# Patient Record
Sex: Male | Born: 1965 | Race: Black or African American | Hispanic: No | Marital: Married
Health system: Southern US, Community
[De-identification: ages and names within clinical notes are randomized; demographics above are authoritative.]

## PROBLEM LIST (undated history)

## (undated) DIAGNOSIS — I10 Essential (primary) hypertension: Secondary | ICD-10-CM

## (undated) DIAGNOSIS — E119 Type 2 diabetes mellitus without complications: Secondary | ICD-10-CM

---

## 2004-05-06 ENCOUNTER — Other Ambulatory Visit: Payer: Self-pay

## 2005-01-14 ENCOUNTER — Emergency Department: Payer: Self-pay | Admitting: Emergency Medicine

## 2005-04-20 ENCOUNTER — Ambulatory Visit: Payer: Self-pay | Admitting: Family Medicine

## 2006-10-04 ENCOUNTER — Other Ambulatory Visit: Payer: Self-pay

## 2006-10-04 ENCOUNTER — Emergency Department: Payer: Self-pay | Admitting: Emergency Medicine

## 2007-08-25 ENCOUNTER — Emergency Department: Payer: Self-pay | Admitting: Emergency Medicine

## 2007-10-20 ENCOUNTER — Emergency Department: Payer: Self-pay | Admitting: Emergency Medicine

## 2007-10-20 ENCOUNTER — Other Ambulatory Visit: Payer: Self-pay

## 2007-12-16 IMAGING — CR DG CHEST 2V
1 series · 2 of 2 positions shown · non-contrast
Comparison: none

REASON FOR EXAM: Angina, diaphoresis
COMMENTS:

PROCEDURE:     DXR - DXR CHEST PA (OR AP) AND LATERAL  - October 04, 2006  [DATE]
RESULT:     The current exam is compared to a prior exam of 01/14/2005.
The lung fields are clear. The heart, mediastinal and osseous structures are
normal in appearance.

[Series 1: view not recorded · 0.17mm/px · 2 of 2 slices shown]
[im 1/2]
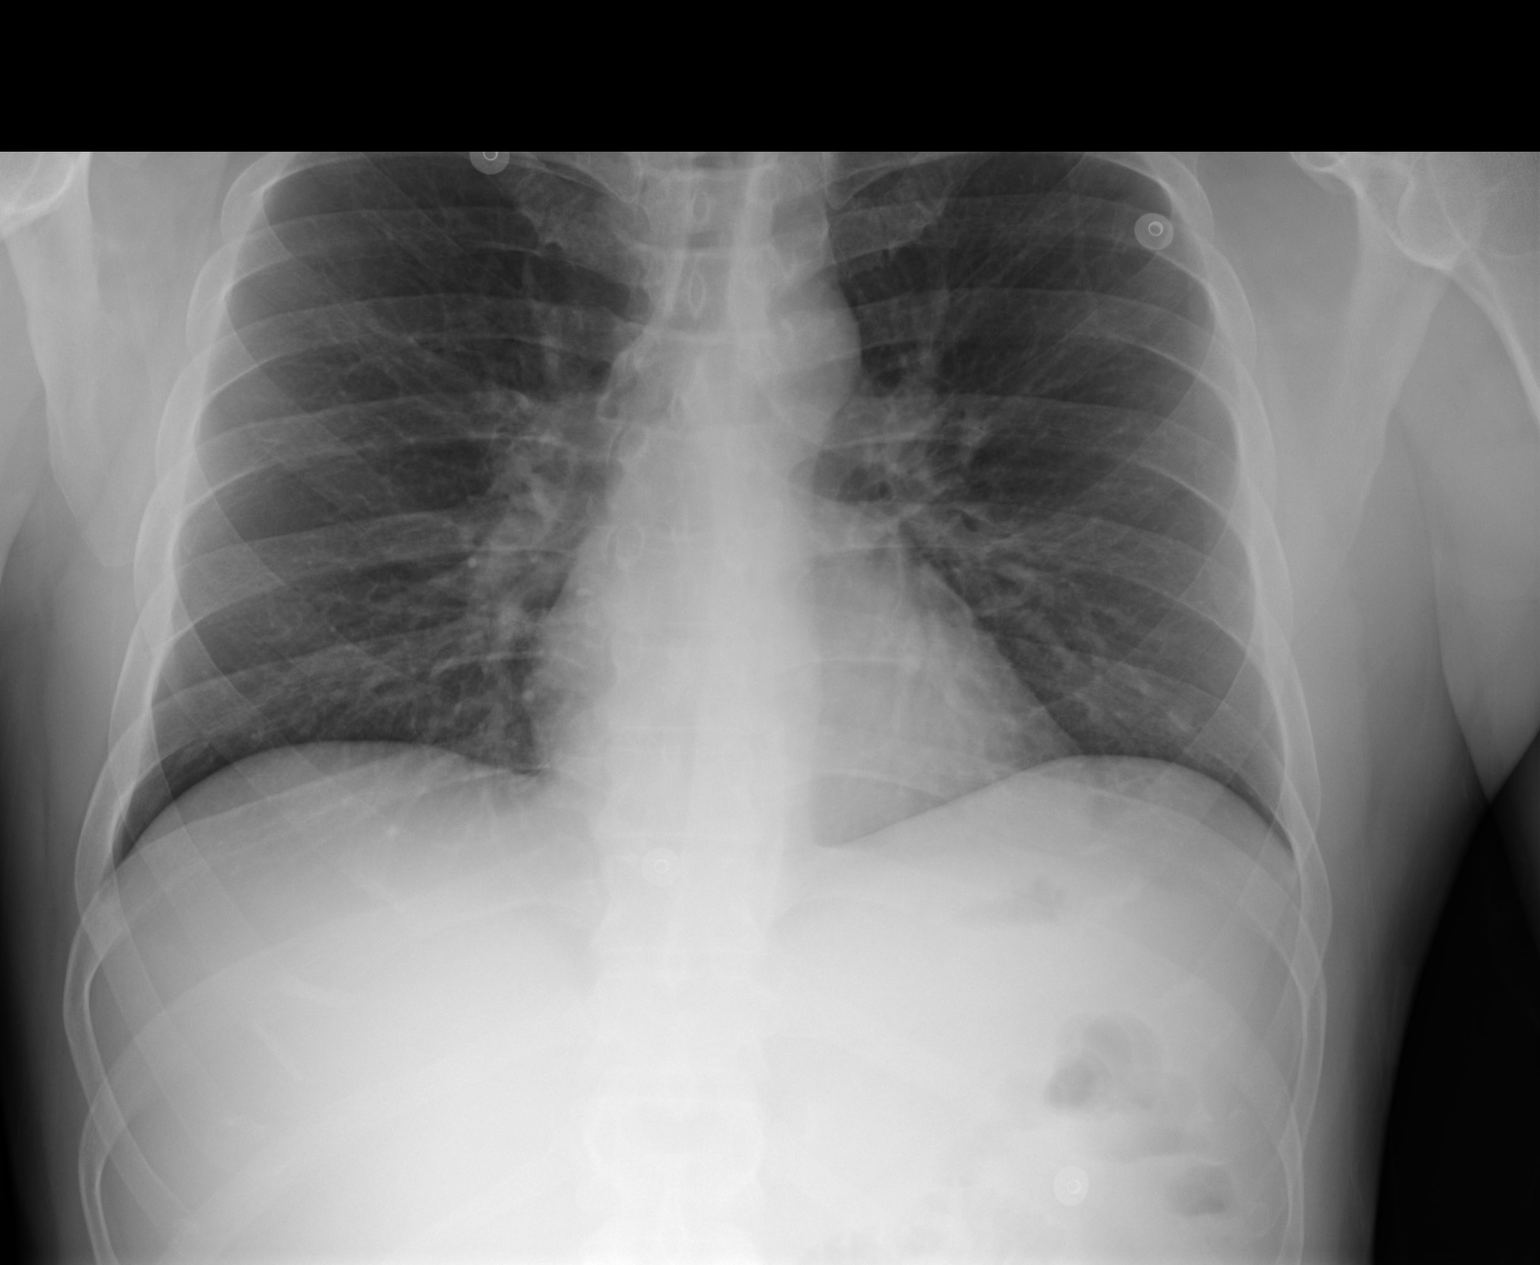
[im 2/2]
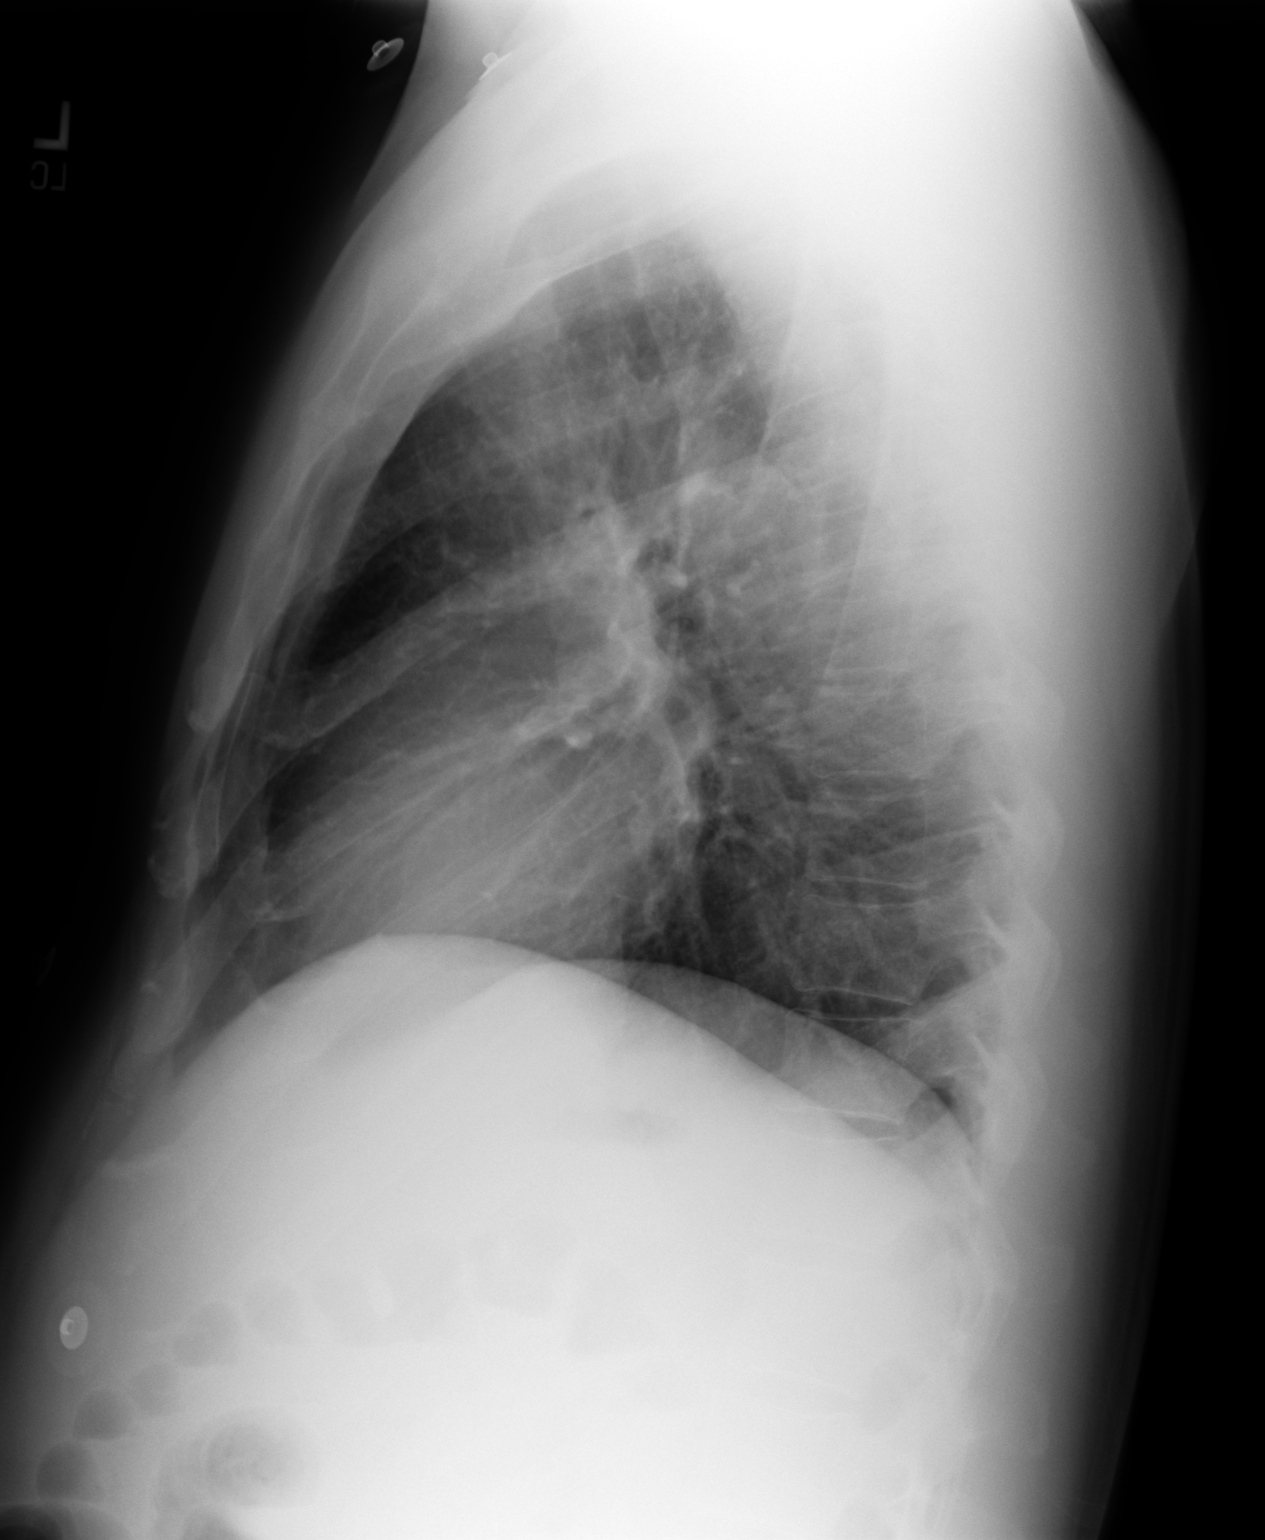

[2 of 2 positions shown; findings below may reference images not displayed]

IMPRESSION: No significant abnormalities are noted.

## 2008-06-05 ENCOUNTER — Emergency Department: Payer: Self-pay
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
## 2008-12-31 IMAGING — CR DG CHEST 2V
1 series · 2 of 2 positions shown · non-contrast
Comparison: none

REASON FOR EXAM: SOB
COMMENTS:

PROCEDURE:     DXR - DXR CHEST PA (OR AP) AND LATERAL  - October 20, 2007  [DATE]
RESULT:     Comparison is made to the prior exam of 10/04/06. The lung fields
are clear. The heart, mediastinal and osseous structures show no significant
abnormalities.

[Series 1: view not recorded · 0.17mm/px · 2 of 2 slices shown]
[im 1/2]
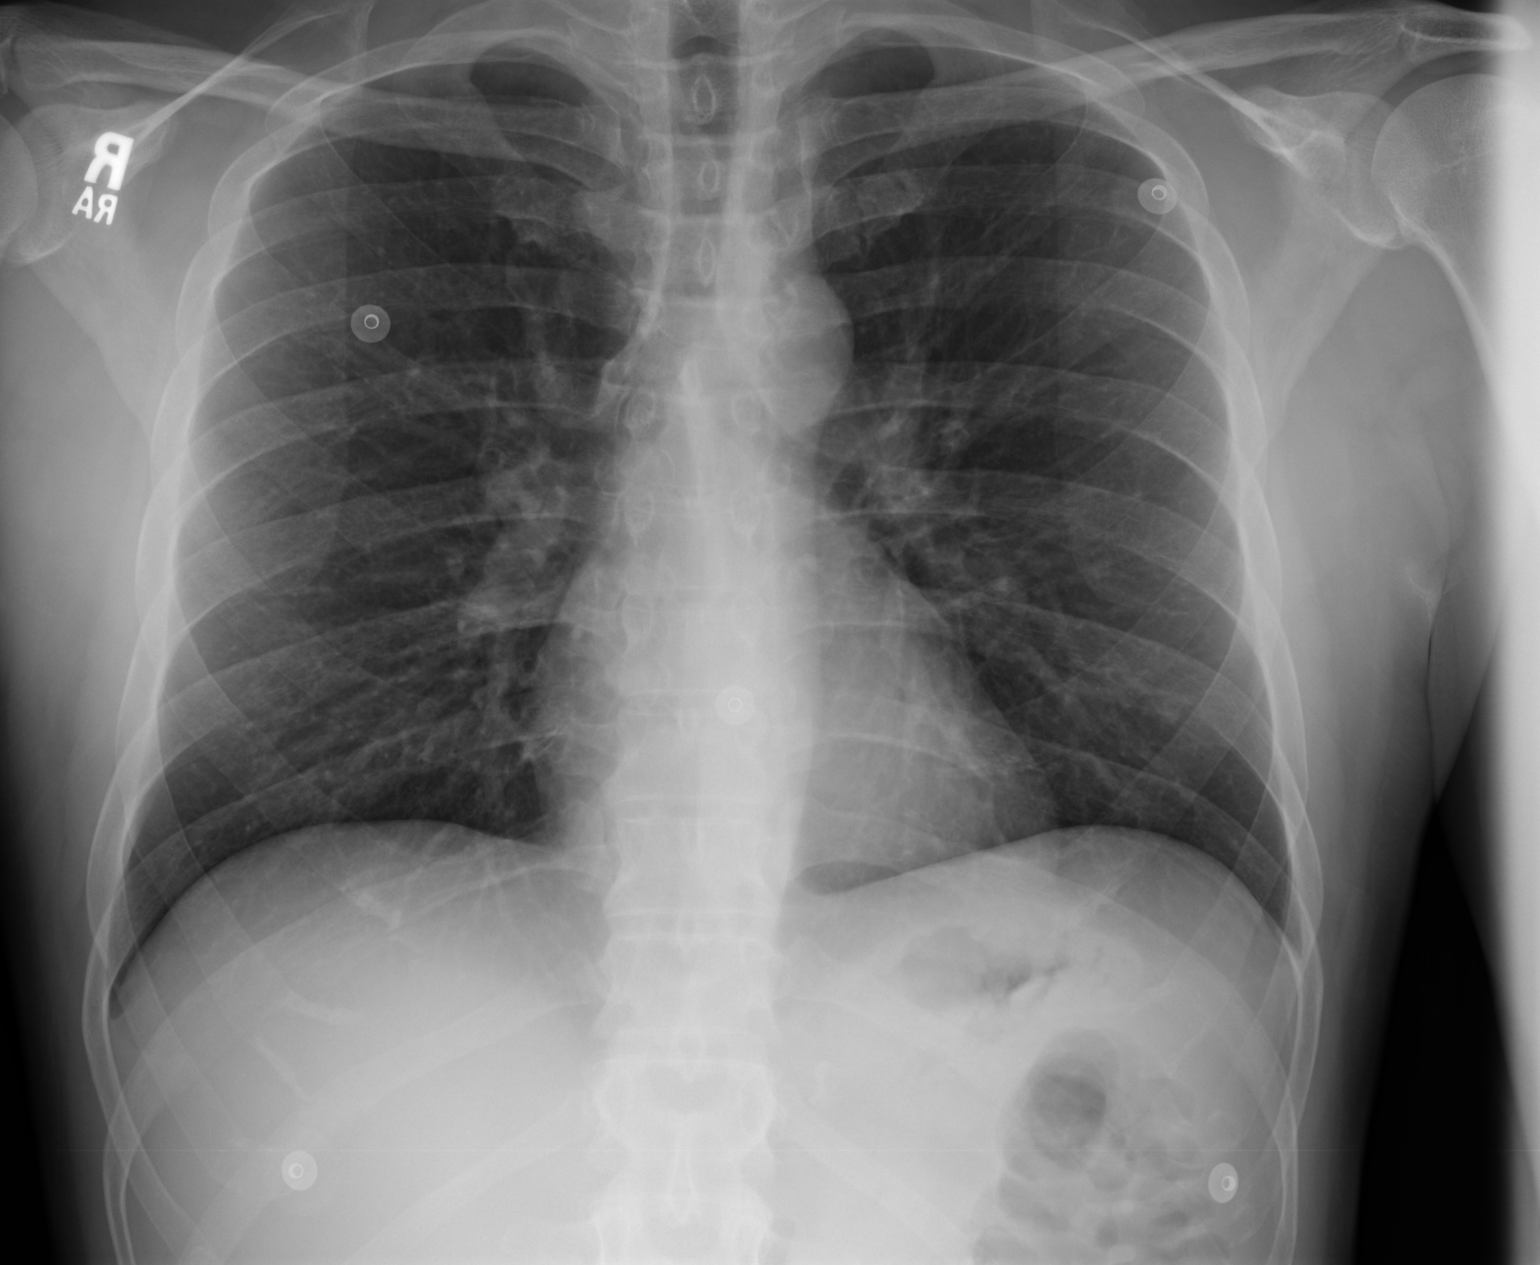
[im 2/2]
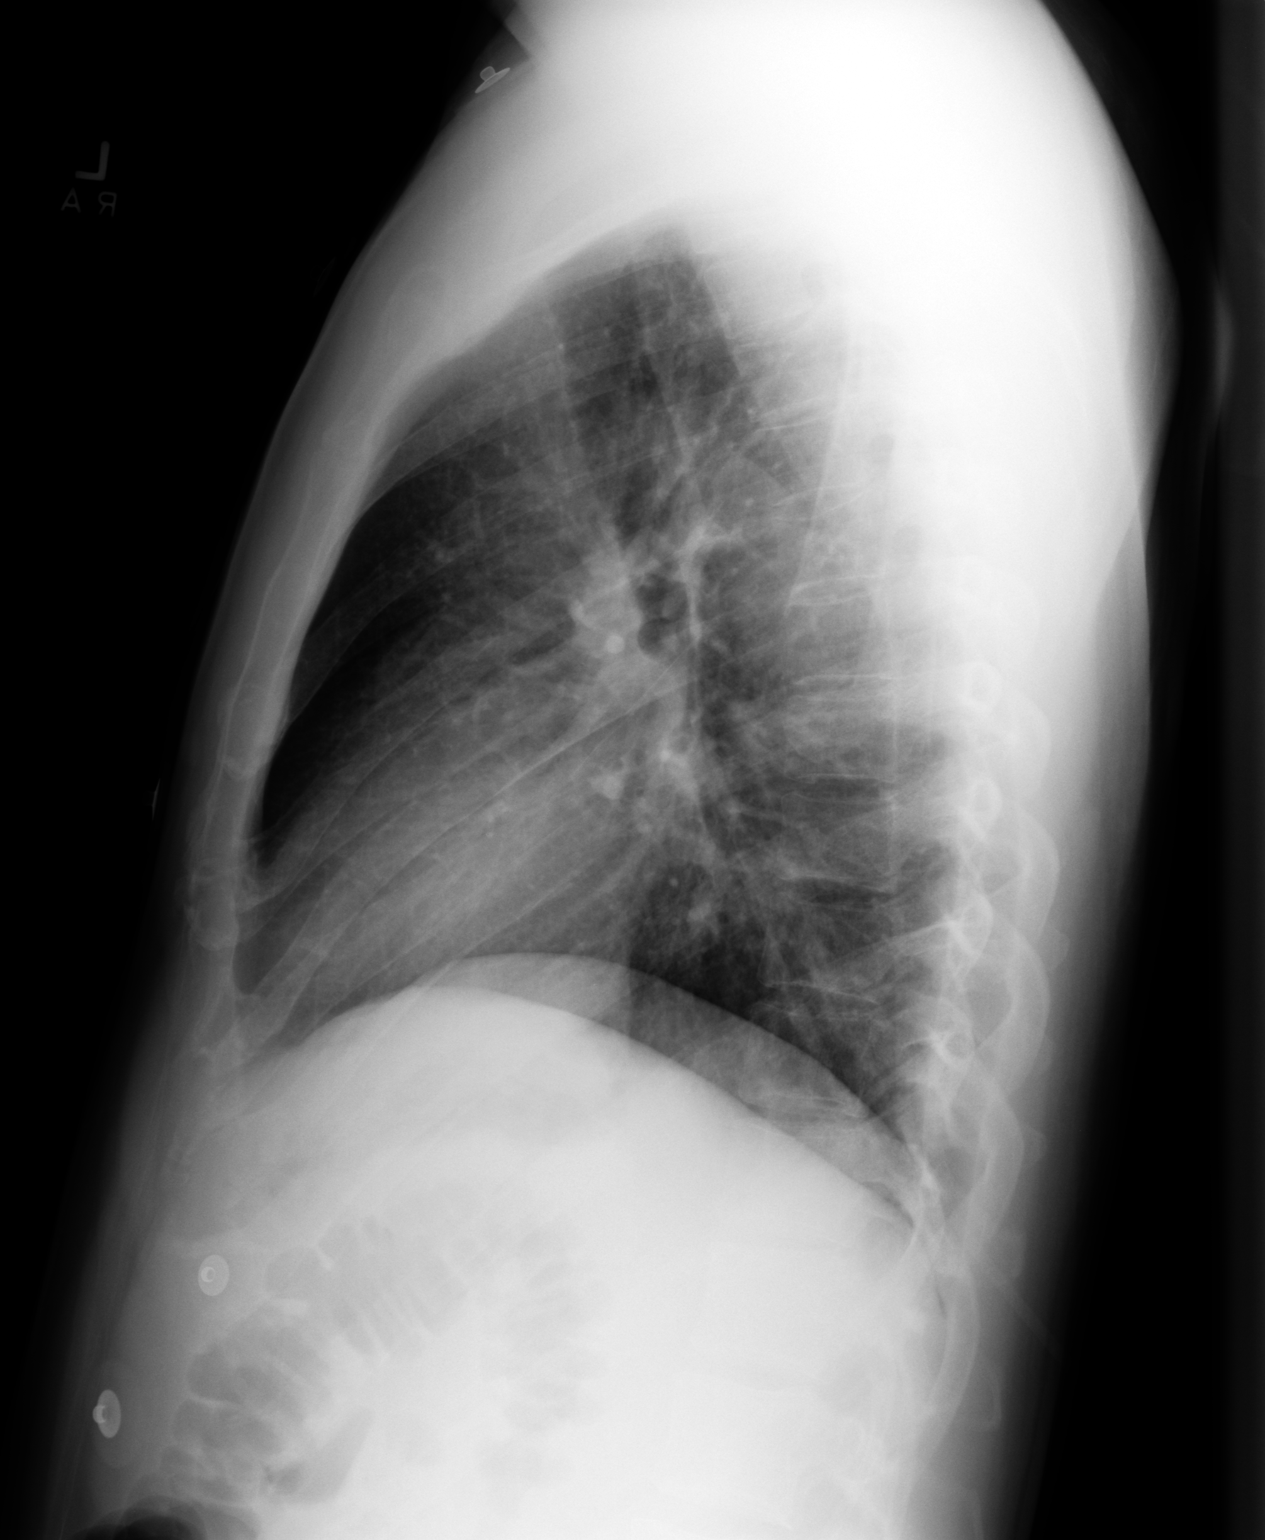

[2 of 2 positions shown; findings below may reference images not displayed]

IMPRESSION: No acute changes are identified.

## 2014-01-30 ENCOUNTER — Emergency Department: Payer: Self-pay | Admitting: Emergency Medicine

## 2018-10-11 ENCOUNTER — Emergency Department
Admission: EM | Admit: 2018-10-11 | Discharge: 2018-10-11 | Disposition: A | Discharge status: Home / Self Care | Payer: Self-pay | Attending: Emergency Medicine | Admitting: Emergency Medicine

## 2018-10-11 ENCOUNTER — Emergency Department: Payer: Self-pay

## 2018-10-11 ENCOUNTER — Encounter: Payer: Self-pay | Admitting: Medical Oncology

## 2018-10-11 DIAGNOSIS — R531 Weakness: Secondary | ICD-10-CM | POA: Insufficient documentation

## 2018-10-11 DIAGNOSIS — R110 Nausea: Secondary | ICD-10-CM | POA: Insufficient documentation

## 2018-10-11 DIAGNOSIS — J111 Influenza due to unidentified influenza virus with other respiratory manifestations: Secondary | ICD-10-CM | POA: Insufficient documentation

## 2018-10-11 DIAGNOSIS — R6889 Other general symptoms and signs: Secondary | ICD-10-CM

## 2018-10-11 DIAGNOSIS — R05 Cough: Secondary | ICD-10-CM | POA: Insufficient documentation

## 2018-10-11 DIAGNOSIS — E119 Type 2 diabetes mellitus without complications: Secondary | ICD-10-CM | POA: Insufficient documentation

## 2018-10-11 DIAGNOSIS — M7918 Myalgia, other site: Secondary | ICD-10-CM | POA: Insufficient documentation

## 2018-10-11 DIAGNOSIS — I10 Essential (primary) hypertension: Secondary | ICD-10-CM | POA: Insufficient documentation

## 2018-10-11 HISTORY — DX: Type 2 diabetes mellitus without complications: E11.9

## 2018-10-11 HISTORY — DX: Essential (primary) hypertension: I10

## 2018-10-11 LAB — COMPREHENSIVE METABOLIC PANEL
ALT: 33 U/L (ref 0–44)
AST: 42 U/L — ABNORMAL HIGH (ref 15–41)
Albumin: 4.2 g/dL (ref 3.5–5.0)
Alkaline Phosphatase: 55 U/L (ref 38–126)
Anion gap: 12 (ref 5–15)
BUN: 14 mg/dL (ref 6–20)
CHLORIDE: 101 mmol/L (ref 98–111)
CO2: 25 mmol/L (ref 22–32)
Calcium: 9.9 mg/dL (ref 8.9–10.3)
Creatinine, Ser: 1.5 mg/dL — ABNORMAL HIGH (ref 0.61–1.24)
GFR calc Af Amer: >60 mL/min (ref >60)
GFR calc non Af Amer: 52 mL/min — ABNORMAL LOW (ref >60)
Glucose, Bld: 272 mg/dL — ABNORMAL HIGH (ref 70–99)
Potassium: 3.7 mmol/L (ref 3.5–5.1)
Sodium: 138 mmol/L (ref 135–145)
Total Bilirubin: 0.5 mg/dL (ref 0.3–1.2)
Total Protein: 7.7 g/dL (ref 6.5–8.1)

## 2018-10-11 LAB — CBC
HEMATOCRIT: 44.5 % (ref 39.0–52.0)
Hemoglobin: 14.5 g/dL (ref 13.0–17.0)
MCH: 29.1 pg (ref 26.0–34.0)
MCHC: 32.6 g/dL (ref 30.0–36.0)
MCV: 89.2 fL (ref 80.0–100.0)
NRBC: 0 % (ref 0.0–0.2)
Platelets: 185 10*3/uL (ref 150–400)
RBC: 4.99 MIL/uL (ref 4.22–5.81)
RDW: 12.7 % (ref 11.5–15.5)
WBC: 7.3 10*3/uL (ref 4.0–10.5)

## 2018-10-11 LAB — INFLUENZA PANEL BY PCR (TYPE A & B)
Influenza A By PCR: NEGATIVE
Influenza B By PCR: NEGATIVE

## 2018-10-11 LAB — LIPASE, BLOOD: LIPASE: 51 U/L (ref 11–51)

## 2018-10-11 MED ORDER — LORAZEPAM 2 MG/ML IJ SOLN
0.5000 mg | Freq: Once | INTRAMUSCULAR | Status: AC
  Administered 2018-10-11: 0.5 mg via INTRAVENOUS
  Filled 2018-10-11: qty 1

## 2018-10-11 MED ORDER — SODIUM CHLORIDE 0.9 % IV SOLN
1000.0000 mL | Freq: Once | INTRAVENOUS | Status: AC
  Administered 2018-10-11: 1000 mL via INTRAVENOUS

## 2018-10-11 NOTE — ED Triage Notes (Signed)
Pt from home via ems with reports of "just feeling sick". Pt describes gen body aches, nausea, vomiting and diarrhea for about 1 week. Pt reports that he just does not feel well.

## 2018-10-11 NOTE — ED Notes (Signed)
EMS pt to lobby , complaining of darker than normal color urine , not feeling well x1 week with fatigue/ flu like symptoms . CBG 290, mask on in the lobby

## 2018-10-11 NOTE — ED Provider Notes (Signed)
[facility] Emergency Department Provider Note   ____________________________________________    I have reviewed the triage vital signs and the nursing notes.   HISTORY  Chief Complaint Generalized Body Aches; Nausea; and Emesis     HPI Antonio Burton is a 53 y.o. male who presents with complaints of fatigue, weakness, mild nausea, body aches over approximately 1 week.  Denies fevers or chills.  Has had a cough, nonproductive.  No chest pain or shortness of breath.  Does admit to drinking alcohol daily, this is a longstanding problem.  Denies drug use.  No recent travel.  No diarrhea.  No abdominal pain   Past Medical History:  Diagnosis Date  . Diabetes mellitus without complication (HCC)   . Hypertension     There are no active problems to display for this patient.     Prior to Admission medications   Not on File     Allergies Patient has no known allergies.  No family history on file.  Social History Daily alcohol use, denies drug use Review of Systems  Constitutional: Fatigue as above Eyes: No visual changes.  ENT: No sore throat. Cardiovascular: Denies chest pain. Respiratory: Denies shortness of breath. Gastrointestinal: No abdominal pain.   Genitourinary: Negative for dysuria. Musculoskeletal: Negative for back pain.  Myalgias  skin: Negative for rash. Neurological: Negative for headaches    ____________________________________________   PHYSICAL EXAM:  VITAL SIGNS: ED Triage Vitals  Enc Vitals Group     BP 10/11/18 1711 (!) 149/90     Pulse Rate 10/11/18 1711 (!) 122     Resp 10/11/18 1711 20     Temp 10/11/18 1711 98.5 F (36.9 C)     Temp Source 10/11/18 1711 Oral     SpO2 10/11/18 1711 99 %     Weight 10/11/18 1711 86.2 kg (190 lb)     Height 10/11/18 1711 1.854 m (6\' 1" )     Head Circumference --      Peak Flow --      Pain Score 10/11/18 1724 10     Pain Loc --      Pain Edu? --      Excl. in GC?  --     Constitutional: Alert and oriented. No acute distress.   Nose: No congestion/rhinnorhea. Mouth/Throat: Mucous membranes are moist.   Neck:  Painless ROM Cardiovascular: Tachycardia, regular rhythm. Grossly normal heart sounds.  Good peripheral circulation. Respiratory: Normal respiratory effort.  No retractions. Lungs CTAB. Gastrointestinal: Soft and nontender. No distention.  No CVA tenderness.  Musculoskeletal: No lower extremity tenderness nor edema.  Warm and well perfused Neurologic:  Normal speech and language. No gross focal neurologic deficits are appreciated.  Skin:  Skin is warm, dry and intact. No rash noted. Psychiatric: Mood and affect are normal. Speech and behavior are normal.  ____________________________________________   LABS (all labs ordered are listed, but only abnormal results are displayed)  Labs Reviewed  COMPREHENSIVE METABOLIC PANEL - Abnormal; Notable for the following components:      Result Value   Glucose, Bld 272 (*)    Creatinine, Ser 1.50 (*)    AST 42 (*)    GFR calc non Af Amer 52 (*)    All other components within normal limits  LIPASE, BLOOD  CBC  INFLUENZA PANEL BY PCR (TYPE A & B)  URINALYSIS, COMPLETE (UACMP) WITH MICROSCOPIC  URINE DRUG SCREEN, QUALITATIVE ([facility] ONLY)   ____________________________________________  EKG  ED ECG REPORT  I, Jene Everyobert Ares Cardozo, the attending physician, personally viewed and interpreted this ECG.  Date: 10/11/2018  Rhythm: Sinus tachycardia QRS Axis: normal Intervals: normal ST/T Wave abnormalities: normal Narrative Interpretation: no evidence of acute ischemia  ____________________________________________  RADIOLOGY  Chest x-ray negative for pneumonia ____________________________________________   PROCEDURES  Procedure(s) performed: No  Procedures   Critical Care performed: No ____________________________________________   INITIAL IMPRESSION / ASSESSMENT AND PLAN / ED  COURSE  Pertinent labs & imaging results that were available during my care of the patient were reviewed by me and considered in my medical decision making (see chart for details).  Patient overall well-appearing and in no acute distress, vital signs are significant for an elevated heart rate initially.  Patient given IV fluids small dose of IV Ativan given history of alcoholism with significant improvement in heart rate.  Lab work is overall unremarkable, influenza negative, chest x-ray negative for pneumonia the patient is feeling significantly better after treatment.  Discussed with patient brother and daughter and they agree with discharge with outpatient follow-up    ____________________________________________   FINAL CLINICAL IMPRESSION(S) / ED DIAGNOSES  Final diagnoses:  Generalized weakness  Flu-like symptoms        Note:  This document was prepared using Dragon voice recognition software and may include unintentional dictation errors.   Jene EveryKinner, Tammee Thielke, MD 10/11/18 1944

## 2018-10-11 NOTE — ED Notes (Signed)
Pt attempted to void to provide a urine sample but was not able to provide one.

## 2019-12-23 IMAGING — CR DG CHEST 2V
1 series · 2 of 2 positions shown · non-contrast
Comparison: PA and lateral chest 10/20/2007.

CLINICAL DATA: Fatigue and flu like symptoms for 1 week.

EXAM:
CHEST - 2 VIEW

[Series 1: w chest pa · 0.14mm/px · 2 of 2 slices shown]
[im 1/2]
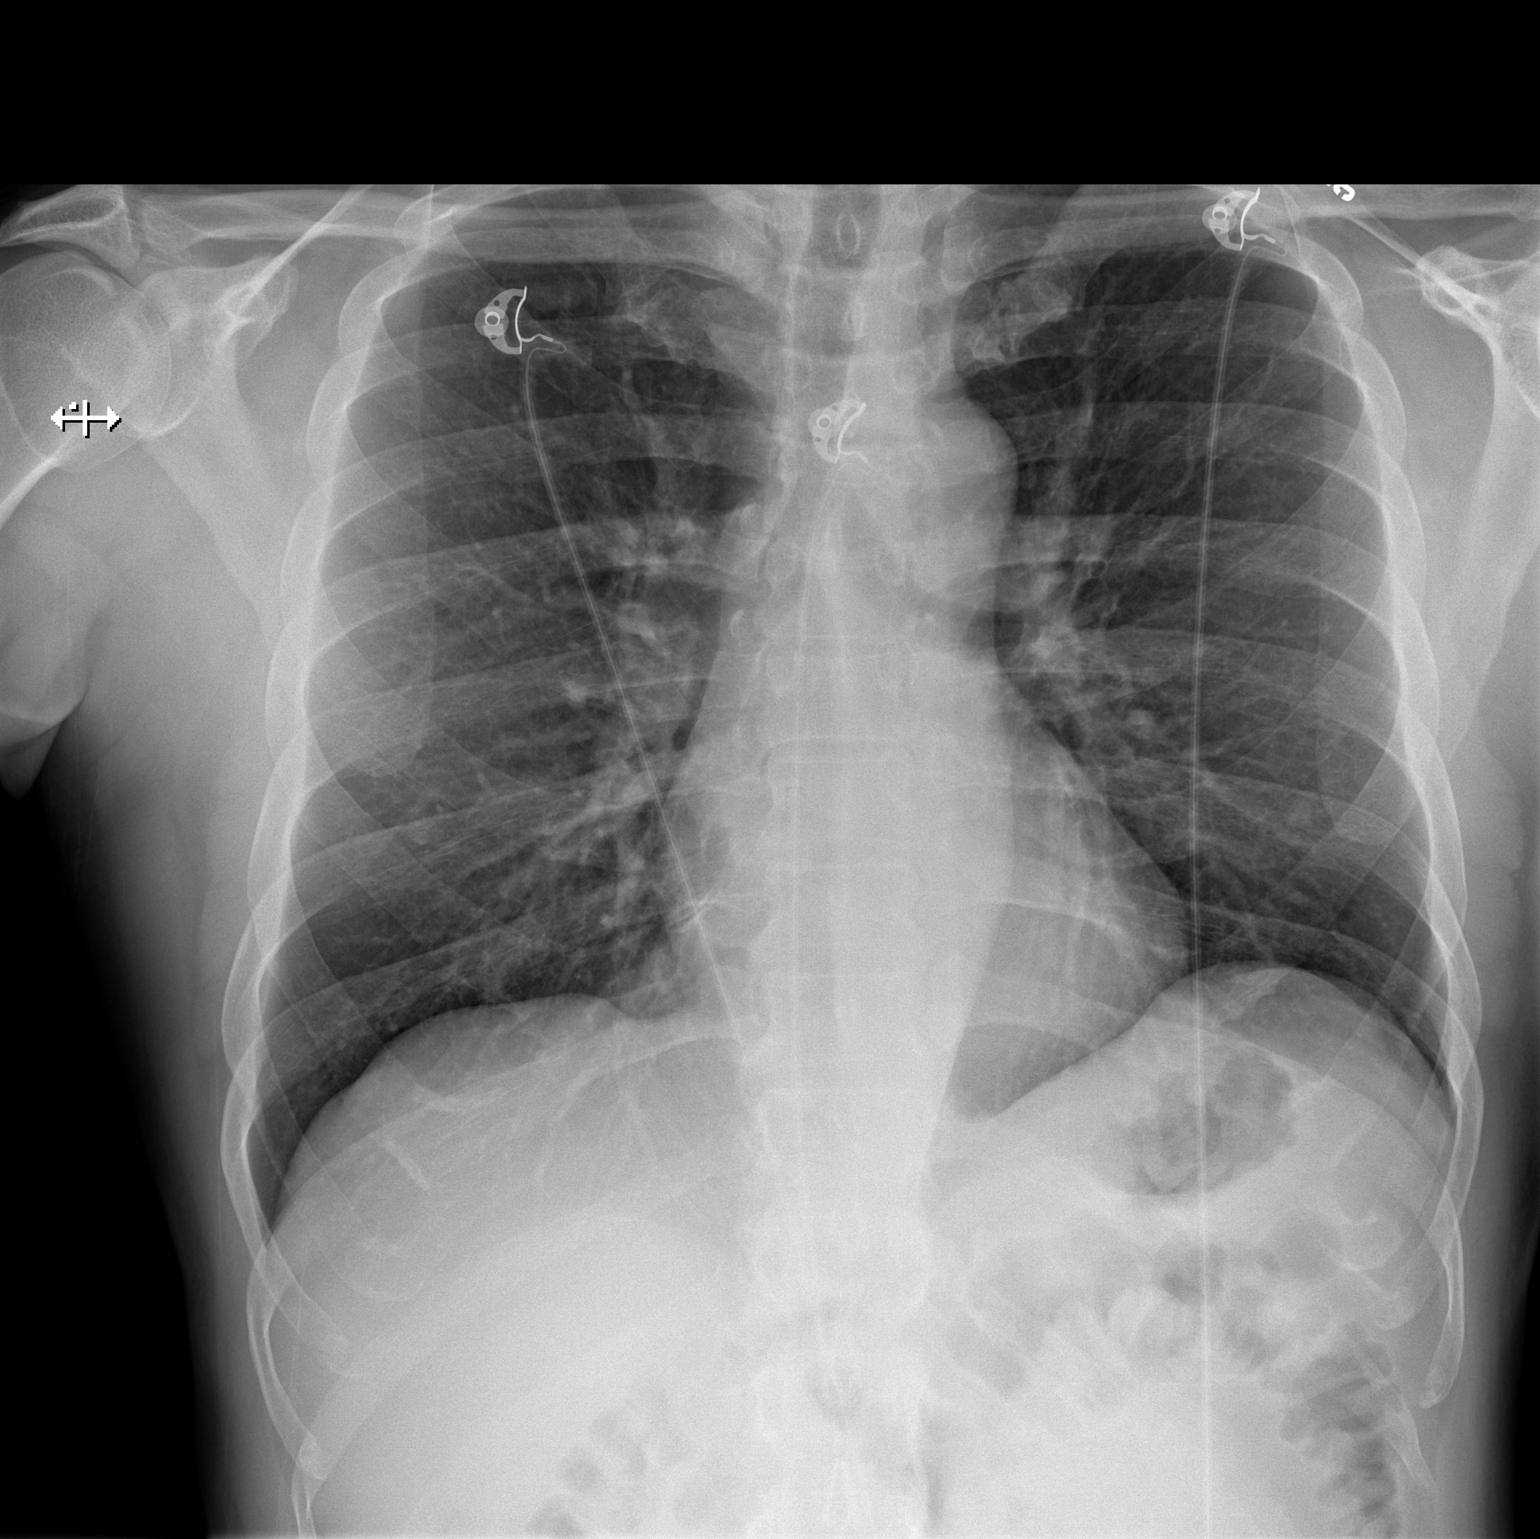
[im 2/2]
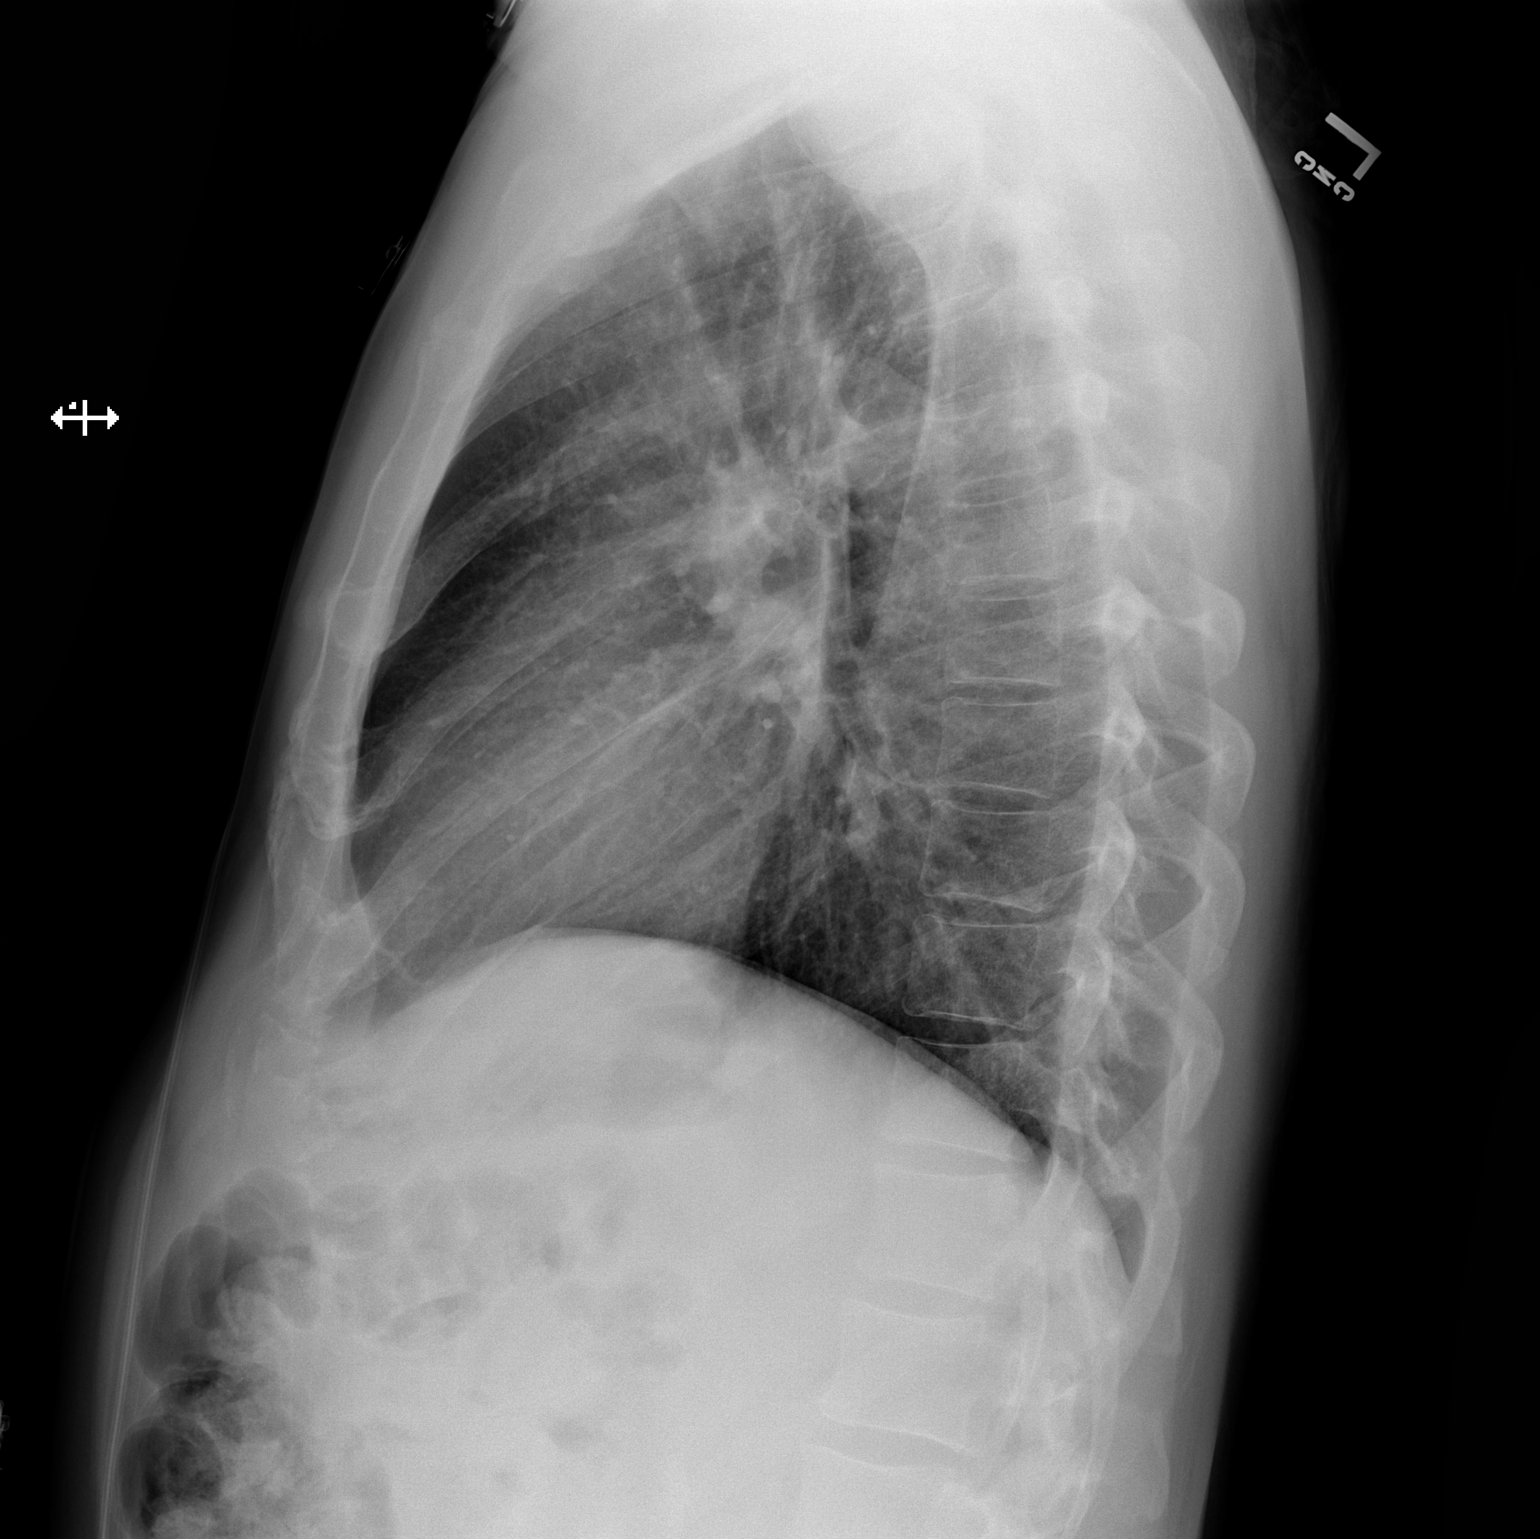

[2 of 2 positions shown; findings below may reference images not displayed]

FINDINGS: Lungs clear. Heart size normal. No pneumothorax or pleural fluid. No
acute or focal bony abnormality.
IMPRESSION: Negative chest.

## 2023-04-23 DIAGNOSIS — M5441 Lumbago with sciatica, right side: Secondary | ICD-10-CM | POA: Diagnosis not present

## 2023-04-23 DIAGNOSIS — D696 Thrombocytopenia, unspecified: Secondary | ICD-10-CM | POA: Diagnosis not present

## 2023-04-23 DIAGNOSIS — E119 Type 2 diabetes mellitus without complications: Secondary | ICD-10-CM | POA: Diagnosis not present

## 2023-04-23 DIAGNOSIS — I10 Essential (primary) hypertension: Secondary | ICD-10-CM | POA: Diagnosis not present

## 2023-04-23 DIAGNOSIS — F17210 Nicotine dependence, cigarettes, uncomplicated: Secondary | ICD-10-CM | POA: Diagnosis not present

## 2023-04-23 DIAGNOSIS — Z794 Long term (current) use of insulin: Secondary | ICD-10-CM | POA: Diagnosis not present

## 2023-11-17 DIAGNOSIS — Z20822 Contact with and (suspected) exposure to covid-19: Secondary | ICD-10-CM | POA: Diagnosis not present

## 2023-11-17 DIAGNOSIS — F17210 Nicotine dependence, cigarettes, uncomplicated: Secondary | ICD-10-CM | POA: Diagnosis not present

## 2023-11-17 DIAGNOSIS — Z7984 Long term (current) use of oral hypoglycemic drugs: Secondary | ICD-10-CM | POA: Diagnosis not present

## 2023-11-17 DIAGNOSIS — Z794 Long term (current) use of insulin: Secondary | ICD-10-CM | POA: Diagnosis not present

## 2023-11-17 DIAGNOSIS — F32A Depression, unspecified: Secondary | ICD-10-CM | POA: Diagnosis not present

## 2023-11-17 DIAGNOSIS — E119 Type 2 diabetes mellitus without complications: Secondary | ICD-10-CM | POA: Diagnosis not present

## 2023-11-17 DIAGNOSIS — Z79899 Other long term (current) drug therapy: Secondary | ICD-10-CM | POA: Diagnosis not present

## 2023-11-17 DIAGNOSIS — R059 Cough, unspecified: Secondary | ICD-10-CM | POA: Diagnosis not present

## 2023-11-17 DIAGNOSIS — R058 Other specified cough: Secondary | ICD-10-CM | POA: Diagnosis not present

## 2023-11-17 DIAGNOSIS — I10 Essential (primary) hypertension: Secondary | ICD-10-CM | POA: Diagnosis not present

## 2024-03-05 DIAGNOSIS — R197 Diarrhea, unspecified: Secondary | ICD-10-CM | POA: Diagnosis not present

## 2024-03-05 DIAGNOSIS — E119 Type 2 diabetes mellitus without complications: Secondary | ICD-10-CM | POA: Diagnosis not present

## 2024-03-05 DIAGNOSIS — R1011 Right upper quadrant pain: Secondary | ICD-10-CM | POA: Diagnosis not present

## 2024-03-05 DIAGNOSIS — K760 Fatty (change of) liver, not elsewhere classified: Secondary | ICD-10-CM | POA: Diagnosis not present

## 2024-03-05 DIAGNOSIS — I10 Essential (primary) hypertension: Secondary | ICD-10-CM | POA: Diagnosis not present

## 2024-03-05 DIAGNOSIS — Z794 Long term (current) use of insulin: Secondary | ICD-10-CM | POA: Diagnosis not present

## 2024-03-05 DIAGNOSIS — R000 Tachycardia, unspecified: Secondary | ICD-10-CM | POA: Diagnosis not present

## 2024-03-05 DIAGNOSIS — R112 Nausea with vomiting, unspecified: Secondary | ICD-10-CM | POA: Diagnosis not present

## 2024-03-19 DIAGNOSIS — R1084 Generalized abdominal pain: Secondary | ICD-10-CM | POA: Diagnosis not present

## 2024-03-19 DIAGNOSIS — R634 Abnormal weight loss: Secondary | ICD-10-CM | POA: Diagnosis not present

## 2024-03-19 DIAGNOSIS — R197 Diarrhea, unspecified: Secondary | ICD-10-CM | POA: Diagnosis not present

## 2024-03-19 DIAGNOSIS — R112 Nausea with vomiting, unspecified: Secondary | ICD-10-CM | POA: Diagnosis not present

## 2024-03-19 DIAGNOSIS — K760 Fatty (change of) liver, not elsewhere classified: Secondary | ICD-10-CM | POA: Diagnosis not present

## 2024-03-19 DIAGNOSIS — R0789 Other chest pain: Secondary | ICD-10-CM | POA: Diagnosis not present

## 2024-03-19 DIAGNOSIS — F1090 Alcohol use, unspecified, uncomplicated: Secondary | ICD-10-CM | POA: Diagnosis not present

## 2024-03-19 DIAGNOSIS — R42 Dizziness and giddiness: Secondary | ICD-10-CM | POA: Diagnosis not present

## 2024-03-19 DIAGNOSIS — R935 Abnormal findings on diagnostic imaging of other abdominal regions, including retroperitoneum: Secondary | ICD-10-CM | POA: Diagnosis not present

## 2024-03-19 DIAGNOSIS — R079 Chest pain, unspecified: Secondary | ICD-10-CM | POA: Diagnosis not present

## 2024-03-19 DIAGNOSIS — R109 Unspecified abdominal pain: Secondary | ICD-10-CM | POA: Diagnosis not present

## 2024-03-19 DIAGNOSIS — E119 Type 2 diabetes mellitus without complications: Secondary | ICD-10-CM | POA: Diagnosis not present

## 2024-03-19 DIAGNOSIS — R918 Other nonspecific abnormal finding of lung field: Secondary | ICD-10-CM | POA: Diagnosis not present

## 2024-03-19 DIAGNOSIS — E1140 Type 2 diabetes mellitus with diabetic neuropathy, unspecified: Secondary | ICD-10-CM | POA: Diagnosis not present

## 2024-03-19 DIAGNOSIS — R0602 Shortness of breath: Secondary | ICD-10-CM | POA: Diagnosis not present

## 2024-03-19 DIAGNOSIS — I10 Essential (primary) hypertension: Secondary | ICD-10-CM | POA: Diagnosis not present

## 2024-03-19 DIAGNOSIS — R000 Tachycardia, unspecified: Secondary | ICD-10-CM | POA: Diagnosis not present

## 2024-03-19 DIAGNOSIS — F17210 Nicotine dependence, cigarettes, uncomplicated: Secondary | ICD-10-CM | POA: Diagnosis not present

## 2024-03-19 DIAGNOSIS — F1010 Alcohol abuse, uncomplicated: Secondary | ICD-10-CM | POA: Diagnosis not present

## 2024-03-20 DIAGNOSIS — R918 Other nonspecific abnormal finding of lung field: Secondary | ICD-10-CM | POA: Diagnosis not present

## 2024-03-20 DIAGNOSIS — R079 Chest pain, unspecified: Secondary | ICD-10-CM | POA: Diagnosis not present

## 2024-03-20 DIAGNOSIS — R112 Nausea with vomiting, unspecified: Secondary | ICD-10-CM | POA: Diagnosis not present

## 2024-03-20 DIAGNOSIS — R9431 Abnormal electrocardiogram [ECG] [EKG]: Secondary | ICD-10-CM | POA: Diagnosis not present

## 2024-03-20 DIAGNOSIS — R42 Dizziness and giddiness: Secondary | ICD-10-CM | POA: Diagnosis not present

## 2024-03-20 DIAGNOSIS — R000 Tachycardia, unspecified: Secondary | ICD-10-CM | POA: Diagnosis not present

## 2024-09-15 DIAGNOSIS — N179 Acute kidney failure, unspecified: Secondary | ICD-10-CM | POA: Diagnosis not present

## 2024-09-15 DIAGNOSIS — A419 Sepsis, unspecified organism: Secondary | ICD-10-CM | POA: Diagnosis not present
